# Patient Record
Sex: Female | Born: 1975 | Race: White | Hispanic: No | Marital: Married | Smoking: Never smoker
Health system: Southern US, Community
[De-identification: ages and names within clinical notes are randomized; demographics above are authoritative.]

## PROBLEM LIST (undated history)

## (undated) DIAGNOSIS — H539 Unspecified visual disturbance: Secondary | ICD-10-CM

## (undated) HISTORY — DX: Unspecified visual disturbance: H53.9

---

## 2016-11-07 DIAGNOSIS — D485 Neoplasm of uncertain behavior of skin: Secondary | ICD-10-CM | POA: Diagnosis not present

## 2016-11-07 DIAGNOSIS — L700 Acne vulgaris: Secondary | ICD-10-CM | POA: Diagnosis not present

## 2016-11-08 DIAGNOSIS — J00 Acute nasopharyngitis [common cold]: Secondary | ICD-10-CM | POA: Diagnosis not present

## 2016-11-18 DIAGNOSIS — Z01419 Encounter for gynecological examination (general) (routine) without abnormal findings: Secondary | ICD-10-CM | POA: Diagnosis not present

## 2016-11-18 DIAGNOSIS — N3941 Urge incontinence: Secondary | ICD-10-CM | POA: Diagnosis not present

## 2016-12-23 DIAGNOSIS — N358 Other urethral stricture: Secondary | ICD-10-CM | POA: Diagnosis not present

## 2016-12-23 DIAGNOSIS — N3000 Acute cystitis without hematuria: Secondary | ICD-10-CM | POA: Diagnosis not present

## 2017-01-02 DIAGNOSIS — N3020 Other chronic cystitis without hematuria: Secondary | ICD-10-CM | POA: Diagnosis not present

## 2017-01-02 DIAGNOSIS — N3281 Overactive bladder: Secondary | ICD-10-CM | POA: Diagnosis not present

## 2017-01-16 DIAGNOSIS — N3941 Urge incontinence: Secondary | ICD-10-CM | POA: Diagnosis not present

## 2017-12-16 DIAGNOSIS — L821 Other seborrheic keratosis: Secondary | ICD-10-CM | POA: Diagnosis not present

## 2017-12-16 DIAGNOSIS — D225 Melanocytic nevi of trunk: Secondary | ICD-10-CM | POA: Diagnosis not present

## 2018-01-07 DIAGNOSIS — R922 Inconclusive mammogram: Secondary | ICD-10-CM | POA: Diagnosis not present

## 2018-01-07 DIAGNOSIS — Z1231 Encounter for screening mammogram for malignant neoplasm of breast: Secondary | ICD-10-CM | POA: Diagnosis not present

## 2018-01-07 DIAGNOSIS — R140 Abdominal distension (gaseous): Secondary | ICD-10-CM | POA: Diagnosis not present

## 2018-01-07 DIAGNOSIS — Z01419 Encounter for gynecological examination (general) (routine) without abnormal findings: Secondary | ICD-10-CM | POA: Diagnosis not present

## 2018-03-17 DIAGNOSIS — M546 Pain in thoracic spine: Secondary | ICD-10-CM | POA: Diagnosis not present

## 2018-03-17 DIAGNOSIS — N200 Calculus of kidney: Secondary | ICD-10-CM | POA: Diagnosis not present

## 2018-03-17 DIAGNOSIS — Z882 Allergy status to sulfonamides status: Secondary | ICD-10-CM | POA: Diagnosis not present

## 2018-03-17 DIAGNOSIS — M542 Cervicalgia: Secondary | ICD-10-CM | POA: Diagnosis not present

## 2018-03-17 DIAGNOSIS — Z880 Allergy status to penicillin: Secondary | ICD-10-CM | POA: Diagnosis not present

## 2018-03-20 DIAGNOSIS — M79601 Pain in right arm: Secondary | ICD-10-CM | POA: Diagnosis not present

## 2018-03-20 DIAGNOSIS — R200 Anesthesia of skin: Secondary | ICD-10-CM | POA: Diagnosis not present

## 2018-03-20 DIAGNOSIS — M25511 Pain in right shoulder: Secondary | ICD-10-CM | POA: Diagnosis not present

## 2018-03-20 DIAGNOSIS — R202 Paresthesia of skin: Secondary | ICD-10-CM | POA: Diagnosis not present

## 2018-03-20 DIAGNOSIS — M542 Cervicalgia: Secondary | ICD-10-CM | POA: Diagnosis not present

## 2018-03-23 DIAGNOSIS — M542 Cervicalgia: Secondary | ICD-10-CM | POA: Diagnosis not present

## 2018-03-23 DIAGNOSIS — S39012A Strain of muscle, fascia and tendon of lower back, initial encounter: Secondary | ICD-10-CM | POA: Diagnosis not present

## 2018-03-23 DIAGNOSIS — S161XXA Strain of muscle, fascia and tendon at neck level, initial encounter: Secondary | ICD-10-CM | POA: Diagnosis not present

## 2018-03-24 DIAGNOSIS — M5416 Radiculopathy, lumbar region: Secondary | ICD-10-CM | POA: Diagnosis not present

## 2018-03-24 DIAGNOSIS — M5412 Radiculopathy, cervical region: Secondary | ICD-10-CM | POA: Diagnosis not present

## 2018-03-25 DIAGNOSIS — K5900 Constipation, unspecified: Secondary | ICD-10-CM | POA: Diagnosis not present

## 2018-04-03 DIAGNOSIS — M545 Low back pain: Secondary | ICD-10-CM | POA: Diagnosis not present

## 2018-04-03 DIAGNOSIS — M5416 Radiculopathy, lumbar region: Secondary | ICD-10-CM | POA: Diagnosis not present

## 2018-04-03 DIAGNOSIS — M542 Cervicalgia: Secondary | ICD-10-CM | POA: Diagnosis not present

## 2018-04-03 DIAGNOSIS — M5412 Radiculopathy, cervical region: Secondary | ICD-10-CM | POA: Diagnosis not present

## 2018-04-03 DIAGNOSIS — M79604 Pain in right leg: Secondary | ICD-10-CM | POA: Diagnosis not present

## 2018-04-03 DIAGNOSIS — M47812 Spondylosis without myelopathy or radiculopathy, cervical region: Secondary | ICD-10-CM | POA: Diagnosis not present

## 2018-04-03 DIAGNOSIS — M79603 Pain in arm, unspecified: Secondary | ICD-10-CM | POA: Diagnosis not present

## 2018-04-03 DIAGNOSIS — M47816 Spondylosis without myelopathy or radiculopathy, lumbar region: Secondary | ICD-10-CM | POA: Diagnosis not present

## 2018-04-07 DIAGNOSIS — M5416 Radiculopathy, lumbar region: Secondary | ICD-10-CM | POA: Diagnosis not present

## 2018-04-07 DIAGNOSIS — M5412 Radiculopathy, cervical region: Secondary | ICD-10-CM | POA: Diagnosis not present

## 2018-04-22 DIAGNOSIS — S79912A Unspecified injury of left hip, initial encounter: Secondary | ICD-10-CM | POA: Diagnosis not present

## 2018-04-22 DIAGNOSIS — R079 Chest pain, unspecified: Secondary | ICD-10-CM | POA: Diagnosis not present

## 2018-04-22 DIAGNOSIS — S299XXA Unspecified injury of thorax, initial encounter: Secondary | ICD-10-CM | POA: Diagnosis not present

## 2018-04-22 DIAGNOSIS — M25552 Pain in left hip: Secondary | ICD-10-CM | POA: Diagnosis not present

## 2018-04-22 DIAGNOSIS — M25551 Pain in right hip: Secondary | ICD-10-CM | POA: Diagnosis not present

## 2018-04-22 DIAGNOSIS — M25511 Pain in right shoulder: Secondary | ICD-10-CM | POA: Diagnosis not present

## 2018-04-22 DIAGNOSIS — M542 Cervicalgia: Secondary | ICD-10-CM | POA: Diagnosis not present

## 2018-04-22 DIAGNOSIS — S199XXA Unspecified injury of neck, initial encounter: Secondary | ICD-10-CM | POA: Diagnosis not present

## 2018-04-22 DIAGNOSIS — S134XXA Sprain of ligaments of cervical spine, initial encounter: Secondary | ICD-10-CM | POA: Diagnosis not present

## 2018-04-22 DIAGNOSIS — S79911A Unspecified injury of right hip, initial encounter: Secondary | ICD-10-CM | POA: Diagnosis not present

## 2018-05-01 DIAGNOSIS — M25511 Pain in right shoulder: Secondary | ICD-10-CM | POA: Diagnosis not present

## 2018-05-01 DIAGNOSIS — M542 Cervicalgia: Secondary | ICD-10-CM | POA: Diagnosis not present

## 2018-05-01 DIAGNOSIS — R5382 Chronic fatigue, unspecified: Secondary | ICD-10-CM | POA: Diagnosis not present

## 2018-05-01 DIAGNOSIS — R51 Headache: Secondary | ICD-10-CM | POA: Diagnosis not present

## 2018-05-06 DIAGNOSIS — M62830 Muscle spasm of back: Secondary | ICD-10-CM | POA: Diagnosis not present

## 2018-05-06 DIAGNOSIS — M542 Cervicalgia: Secondary | ICD-10-CM | POA: Diagnosis not present

## 2018-05-06 DIAGNOSIS — S134XXD Sprain of ligaments of cervical spine, subsequent encounter: Secondary | ICD-10-CM | POA: Diagnosis not present

## 2018-05-07 DIAGNOSIS — M542 Cervicalgia: Secondary | ICD-10-CM | POA: Diagnosis not present

## 2018-05-07 DIAGNOSIS — S134XXD Sprain of ligaments of cervical spine, subsequent encounter: Secondary | ICD-10-CM | POA: Diagnosis not present

## 2018-05-07 DIAGNOSIS — M62830 Muscle spasm of back: Secondary | ICD-10-CM | POA: Diagnosis not present

## 2018-05-12 DIAGNOSIS — M542 Cervicalgia: Secondary | ICD-10-CM | POA: Diagnosis not present

## 2018-05-12 DIAGNOSIS — S134XXD Sprain of ligaments of cervical spine, subsequent encounter: Secondary | ICD-10-CM | POA: Diagnosis not present

## 2018-05-12 DIAGNOSIS — M62830 Muscle spasm of back: Secondary | ICD-10-CM | POA: Diagnosis not present

## 2018-05-14 DIAGNOSIS — S134XXD Sprain of ligaments of cervical spine, subsequent encounter: Secondary | ICD-10-CM | POA: Diagnosis not present

## 2018-05-14 DIAGNOSIS — M542 Cervicalgia: Secondary | ICD-10-CM | POA: Diagnosis not present

## 2018-05-14 DIAGNOSIS — M62830 Muscle spasm of back: Secondary | ICD-10-CM | POA: Diagnosis not present

## 2018-05-19 DIAGNOSIS — M542 Cervicalgia: Secondary | ICD-10-CM | POA: Diagnosis not present

## 2018-05-19 DIAGNOSIS — S134XXD Sprain of ligaments of cervical spine, subsequent encounter: Secondary | ICD-10-CM | POA: Diagnosis not present

## 2018-05-19 DIAGNOSIS — M62830 Muscle spasm of back: Secondary | ICD-10-CM | POA: Diagnosis not present

## 2018-05-21 DIAGNOSIS — S134XXD Sprain of ligaments of cervical spine, subsequent encounter: Secondary | ICD-10-CM | POA: Diagnosis not present

## 2018-05-21 DIAGNOSIS — M542 Cervicalgia: Secondary | ICD-10-CM | POA: Diagnosis not present

## 2018-05-21 DIAGNOSIS — M62830 Muscle spasm of back: Secondary | ICD-10-CM | POA: Diagnosis not present

## 2018-05-22 DIAGNOSIS — M791 Myalgia, unspecified site: Secondary | ICD-10-CM | POA: Diagnosis not present

## 2018-05-22 DIAGNOSIS — R51 Headache: Secondary | ICD-10-CM | POA: Diagnosis not present

## 2018-05-22 DIAGNOSIS — M542 Cervicalgia: Secondary | ICD-10-CM | POA: Diagnosis not present

## 2018-05-22 DIAGNOSIS — Z1331 Encounter for screening for depression: Secondary | ICD-10-CM | POA: Diagnosis not present

## 2018-05-22 DIAGNOSIS — M25511 Pain in right shoulder: Secondary | ICD-10-CM | POA: Diagnosis not present

## 2018-05-26 DIAGNOSIS — M62830 Muscle spasm of back: Secondary | ICD-10-CM | POA: Diagnosis not present

## 2018-05-26 DIAGNOSIS — S134XXD Sprain of ligaments of cervical spine, subsequent encounter: Secondary | ICD-10-CM | POA: Diagnosis not present

## 2018-05-26 DIAGNOSIS — M542 Cervicalgia: Secondary | ICD-10-CM | POA: Diagnosis not present

## 2018-05-28 DIAGNOSIS — M542 Cervicalgia: Secondary | ICD-10-CM | POA: Diagnosis not present

## 2018-05-28 DIAGNOSIS — M62830 Muscle spasm of back: Secondary | ICD-10-CM | POA: Diagnosis not present

## 2018-05-28 DIAGNOSIS — S134XXD Sprain of ligaments of cervical spine, subsequent encounter: Secondary | ICD-10-CM | POA: Diagnosis not present

## 2018-06-02 DIAGNOSIS — M5416 Radiculopathy, lumbar region: Secondary | ICD-10-CM | POA: Diagnosis not present

## 2018-06-02 DIAGNOSIS — M62830 Muscle spasm of back: Secondary | ICD-10-CM | POA: Diagnosis not present

## 2018-06-02 DIAGNOSIS — S134XXD Sprain of ligaments of cervical spine, subsequent encounter: Secondary | ICD-10-CM | POA: Diagnosis not present

## 2018-06-02 DIAGNOSIS — M5412 Radiculopathy, cervical region: Secondary | ICD-10-CM | POA: Diagnosis not present

## 2018-06-02 DIAGNOSIS — M542 Cervicalgia: Secondary | ICD-10-CM | POA: Diagnosis not present

## 2018-06-04 DIAGNOSIS — S134XXD Sprain of ligaments of cervical spine, subsequent encounter: Secondary | ICD-10-CM | POA: Diagnosis not present

## 2018-06-04 DIAGNOSIS — M542 Cervicalgia: Secondary | ICD-10-CM | POA: Diagnosis not present

## 2018-06-04 DIAGNOSIS — M62830 Muscle spasm of back: Secondary | ICD-10-CM | POA: Diagnosis not present

## 2018-06-08 DIAGNOSIS — R51 Headache: Secondary | ICD-10-CM | POA: Diagnosis not present

## 2018-06-08 DIAGNOSIS — F0781 Postconcussional syndrome: Secondary | ICD-10-CM | POA: Diagnosis not present

## 2018-06-09 DIAGNOSIS — S134XXD Sprain of ligaments of cervical spine, subsequent encounter: Secondary | ICD-10-CM | POA: Diagnosis not present

## 2018-06-09 DIAGNOSIS — M62830 Muscle spasm of back: Secondary | ICD-10-CM | POA: Diagnosis not present

## 2018-06-09 DIAGNOSIS — M542 Cervicalgia: Secondary | ICD-10-CM | POA: Diagnosis not present

## 2018-07-02 DIAGNOSIS — M9901 Segmental and somatic dysfunction of cervical region: Secondary | ICD-10-CM | POA: Diagnosis not present

## 2018-07-02 DIAGNOSIS — R51 Headache: Secondary | ICD-10-CM | POA: Diagnosis not present

## 2018-07-02 DIAGNOSIS — M9902 Segmental and somatic dysfunction of thoracic region: Secondary | ICD-10-CM | POA: Diagnosis not present

## 2018-07-02 DIAGNOSIS — S134XXA Sprain of ligaments of cervical spine, initial encounter: Secondary | ICD-10-CM | POA: Diagnosis not present

## 2018-07-07 DIAGNOSIS — M9902 Segmental and somatic dysfunction of thoracic region: Secondary | ICD-10-CM | POA: Diagnosis not present

## 2018-07-07 DIAGNOSIS — M9901 Segmental and somatic dysfunction of cervical region: Secondary | ICD-10-CM | POA: Diagnosis not present

## 2018-07-07 DIAGNOSIS — S134XXA Sprain of ligaments of cervical spine, initial encounter: Secondary | ICD-10-CM | POA: Diagnosis not present

## 2018-07-07 DIAGNOSIS — R51 Headache: Secondary | ICD-10-CM | POA: Diagnosis not present

## 2018-07-08 DIAGNOSIS — R51 Headache: Secondary | ICD-10-CM | POA: Diagnosis not present

## 2018-07-08 DIAGNOSIS — M25511 Pain in right shoulder: Secondary | ICD-10-CM | POA: Diagnosis not present

## 2018-07-08 DIAGNOSIS — F0781 Postconcussional syndrome: Secondary | ICD-10-CM | POA: Diagnosis not present

## 2018-07-08 DIAGNOSIS — M542 Cervicalgia: Secondary | ICD-10-CM | POA: Diagnosis not present

## 2018-07-14 ENCOUNTER — Encounter: Payer: Self-pay | Admitting: Neurology

## 2018-07-14 ENCOUNTER — Other Ambulatory Visit: Payer: Self-pay

## 2018-07-14 ENCOUNTER — Ambulatory Visit: Payer: BLUE CROSS/BLUE SHIELD | Admitting: Neurology

## 2018-07-14 DIAGNOSIS — S134XXA Sprain of ligaments of cervical spine, initial encounter: Secondary | ICD-10-CM | POA: Diagnosis not present

## 2018-07-14 DIAGNOSIS — R51 Headache: Secondary | ICD-10-CM | POA: Diagnosis not present

## 2018-07-14 DIAGNOSIS — M9902 Segmental and somatic dysfunction of thoracic region: Secondary | ICD-10-CM | POA: Diagnosis not present

## 2018-07-14 DIAGNOSIS — M9901 Segmental and somatic dysfunction of cervical region: Secondary | ICD-10-CM | POA: Diagnosis not present

## 2018-07-14 DIAGNOSIS — G4486 Cervicogenic headache: Secondary | ICD-10-CM | POA: Insufficient documentation

## 2018-07-14 MED ORDER — MELOXICAM 15 MG PO TABS: 15.0000 mg | ORAL_TABLET | Freq: Every day | ORAL | 3 refills | Status: DC

## 2018-07-14 MED ORDER — NORTRIPTYLINE HCL 10 MG PO CAPS: ORAL_CAPSULE | ORAL | 3 refills | Status: DC

## 2018-07-14 NOTE — Patient Instructions (Signed)
We will start Mobic 15 mg a day and nortriptyline at night for the headache.   Pamelor (nortriptyline) is an antidepressant medication that has many uses that may include headache, whiplash injuries, or for peripheral neuropathy pain. Side effects may include drowsiness, dry mouth, blurred vision, or constipation. As with any antidepressant medication, worsening depression may occur. If you had any significant side effects, please call our office. The full effects of this medication may take 7-10 days after starting the drug, or going up on the dose.
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
## 2018-07-23 ENCOUNTER — Telehealth: Payer: Self-pay | Admitting: Neurology

## 2018-07-23 NOTE — Telephone Encounter (Signed)
I called the patient.  The patient has not even gotten up to the 30 mg dose of the nortriptyline yet.  As long as she is tolerating the drug she is to stay with the medication, call me in a couple weeks, if she still having headaches we will push the dose to 50 mg or 75 mg dose to help the cervicogenic headache.  The beneficial effects of the nortriptyline may take a week or more after the dose has been increased.

## 2018-07-23 NOTE — Telephone Encounter (Signed)
Pt requesting a call stating that nortriptyline (PAMELOR) 10 MG capsule has not helped since starting. Also stating that after taking her 1st dose she had the "wrost h/a of my life". Stating every so often she experiences sharpe pains in head and a tingling sensation.  Requesting a call to discuss

## 2018-07-24 DIAGNOSIS — S134XXA Sprain of ligaments of cervical spine, initial encounter: Secondary | ICD-10-CM | POA: Diagnosis not present

## 2018-07-24 DIAGNOSIS — M9901 Segmental and somatic dysfunction of cervical region: Secondary | ICD-10-CM | POA: Diagnosis not present

## 2018-07-24 DIAGNOSIS — R51 Headache: Secondary | ICD-10-CM | POA: Diagnosis not present

## 2018-07-24 DIAGNOSIS — M9902 Segmental and somatic dysfunction of thoracic region: Secondary | ICD-10-CM | POA: Diagnosis not present

## 2018-07-28 DIAGNOSIS — S134XXA Sprain of ligaments of cervical spine, initial encounter: Secondary | ICD-10-CM | POA: Diagnosis not present

## 2018-07-28 DIAGNOSIS — M9901 Segmental and somatic dysfunction of cervical region: Secondary | ICD-10-CM | POA: Diagnosis not present

## 2018-07-28 DIAGNOSIS — M9902 Segmental and somatic dysfunction of thoracic region: Secondary | ICD-10-CM | POA: Diagnosis not present

## 2018-07-30 DIAGNOSIS — M9902 Segmental and somatic dysfunction of thoracic region: Secondary | ICD-10-CM | POA: Diagnosis not present

## 2018-07-30 DIAGNOSIS — M9901 Segmental and somatic dysfunction of cervical region: Secondary | ICD-10-CM | POA: Diagnosis not present

## 2018-07-30 DIAGNOSIS — R51 Headache: Secondary | ICD-10-CM | POA: Diagnosis not present

## 2018-07-30 DIAGNOSIS — S134XXA Sprain of ligaments of cervical spine, initial encounter: Secondary | ICD-10-CM | POA: Diagnosis not present

## 2018-08-04 DIAGNOSIS — M9902 Segmental and somatic dysfunction of thoracic region: Secondary | ICD-10-CM | POA: Diagnosis not present

## 2018-08-04 DIAGNOSIS — M9901 Segmental and somatic dysfunction of cervical region: Secondary | ICD-10-CM | POA: Diagnosis not present

## 2018-08-04 DIAGNOSIS — S134XXA Sprain of ligaments of cervical spine, initial encounter: Secondary | ICD-10-CM | POA: Diagnosis not present

## 2018-08-04 DIAGNOSIS — R51 Headache: Secondary | ICD-10-CM | POA: Diagnosis not present

## 2018-08-10 DIAGNOSIS — M62830 Muscle spasm of back: Secondary | ICD-10-CM | POA: Diagnosis not present

## 2018-08-10 DIAGNOSIS — M542 Cervicalgia: Secondary | ICD-10-CM | POA: Diagnosis not present

## 2018-08-10 DIAGNOSIS — S134XXD Sprain of ligaments of cervical spine, subsequent encounter: Secondary | ICD-10-CM | POA: Diagnosis not present

## 2018-08-12 DIAGNOSIS — R51 Headache: Secondary | ICD-10-CM | POA: Diagnosis not present

## 2018-08-12 DIAGNOSIS — M9902 Segmental and somatic dysfunction of thoracic region: Secondary | ICD-10-CM | POA: Diagnosis not present

## 2018-08-12 DIAGNOSIS — M9901 Segmental and somatic dysfunction of cervical region: Secondary | ICD-10-CM | POA: Diagnosis not present

## 2018-08-12 DIAGNOSIS — S134XXA Sprain of ligaments of cervical spine, initial encounter: Secondary | ICD-10-CM | POA: Diagnosis not present

## 2018-08-14 DIAGNOSIS — M62830 Muscle spasm of back: Secondary | ICD-10-CM | POA: Diagnosis not present

## 2018-08-14 DIAGNOSIS — S134XXD Sprain of ligaments of cervical spine, subsequent encounter: Secondary | ICD-10-CM | POA: Diagnosis not present

## 2018-08-14 DIAGNOSIS — M542 Cervicalgia: Secondary | ICD-10-CM | POA: Diagnosis not present

## 2018-08-17 DIAGNOSIS — M542 Cervicalgia: Secondary | ICD-10-CM | POA: Diagnosis not present

## 2018-08-17 DIAGNOSIS — M62830 Muscle spasm of back: Secondary | ICD-10-CM | POA: Diagnosis not present

## 2018-08-17 DIAGNOSIS — S134XXD Sprain of ligaments of cervical spine, subsequent encounter: Secondary | ICD-10-CM | POA: Diagnosis not present

## 2018-08-18 DIAGNOSIS — Z1231 Encounter for screening mammogram for malignant neoplasm of breast: Secondary | ICD-10-CM | POA: Diagnosis not present

## 2018-08-19 DIAGNOSIS — S134XXD Sprain of ligaments of cervical spine, subsequent encounter: Secondary | ICD-10-CM | POA: Diagnosis not present

## 2018-08-19 DIAGNOSIS — M542 Cervicalgia: Secondary | ICD-10-CM | POA: Diagnosis not present

## 2018-08-19 DIAGNOSIS — M62830 Muscle spasm of back: Secondary | ICD-10-CM | POA: Diagnosis not present

## 2018-08-20 DIAGNOSIS — K5909 Other constipation: Secondary | ICD-10-CM | POA: Diagnosis not present

## 2018-08-20 DIAGNOSIS — F0781 Postconcussional syndrome: Secondary | ICD-10-CM | POA: Diagnosis not present

## 2018-08-20 DIAGNOSIS — M25511 Pain in right shoulder: Secondary | ICD-10-CM | POA: Diagnosis not present

## 2018-08-20 DIAGNOSIS — M542 Cervicalgia: Secondary | ICD-10-CM | POA: Diagnosis not present

## 2018-08-21 DIAGNOSIS — M5412 Radiculopathy, cervical region: Secondary | ICD-10-CM | POA: Diagnosis not present

## 2018-08-21 DIAGNOSIS — M5416 Radiculopathy, lumbar region: Secondary | ICD-10-CM | POA: Diagnosis not present

## 2018-08-24 DIAGNOSIS — M62830 Muscle spasm of back: Secondary | ICD-10-CM | POA: Diagnosis not present

## 2018-08-24 DIAGNOSIS — S134XXD Sprain of ligaments of cervical spine, subsequent encounter: Secondary | ICD-10-CM | POA: Diagnosis not present

## 2018-08-24 DIAGNOSIS — M542 Cervicalgia: Secondary | ICD-10-CM | POA: Diagnosis not present

## 2018-08-26 DIAGNOSIS — S134XXD Sprain of ligaments of cervical spine, subsequent encounter: Secondary | ICD-10-CM | POA: Diagnosis not present

## 2018-08-26 DIAGNOSIS — M62830 Muscle spasm of back: Secondary | ICD-10-CM | POA: Diagnosis not present

## 2018-08-26 DIAGNOSIS — M542 Cervicalgia: Secondary | ICD-10-CM | POA: Diagnosis not present

## 2018-09-07 DIAGNOSIS — S134XXD Sprain of ligaments of cervical spine, subsequent encounter: Secondary | ICD-10-CM | POA: Diagnosis not present

## 2018-09-07 DIAGNOSIS — M62830 Muscle spasm of back: Secondary | ICD-10-CM | POA: Diagnosis not present

## 2018-09-07 DIAGNOSIS — M542 Cervicalgia: Secondary | ICD-10-CM | POA: Diagnosis not present

## 2018-09-08 DIAGNOSIS — M62830 Muscle spasm of back: Secondary | ICD-10-CM | POA: Diagnosis not present

## 2018-09-08 DIAGNOSIS — S134XXD Sprain of ligaments of cervical spine, subsequent encounter: Secondary | ICD-10-CM | POA: Diagnosis not present

## 2018-09-08 DIAGNOSIS — M542 Cervicalgia: Secondary | ICD-10-CM | POA: Diagnosis not present

## 2018-09-14 DIAGNOSIS — S134XXD Sprain of ligaments of cervical spine, subsequent encounter: Secondary | ICD-10-CM | POA: Diagnosis not present

## 2018-09-14 DIAGNOSIS — M542 Cervicalgia: Secondary | ICD-10-CM | POA: Diagnosis not present

## 2018-09-14 DIAGNOSIS — M62830 Muscle spasm of back: Secondary | ICD-10-CM | POA: Diagnosis not present

## 2018-09-16 DIAGNOSIS — S134XXD Sprain of ligaments of cervical spine, subsequent encounter: Secondary | ICD-10-CM | POA: Diagnosis not present

## 2018-09-16 DIAGNOSIS — M62830 Muscle spasm of back: Secondary | ICD-10-CM | POA: Diagnosis not present

## 2018-09-16 DIAGNOSIS — M542 Cervicalgia: Secondary | ICD-10-CM | POA: Diagnosis not present

## 2018-09-17 ENCOUNTER — Encounter: Payer: Self-pay | Admitting: Neurology

## 2018-09-17 ENCOUNTER — Ambulatory Visit: Payer: BLUE CROSS/BLUE SHIELD | Admitting: Neurology

## 2018-09-17 VITALS — BP 99/57 | HR 63 | Ht 64.0 in | Wt 155.0 lb

## 2018-09-17 DIAGNOSIS — R51 Headache: Secondary | ICD-10-CM | POA: Diagnosis not present

## 2018-09-17 DIAGNOSIS — G4486 Cervicogenic headache: Secondary | ICD-10-CM

## 2018-09-17 MED ORDER — GABAPENTIN 100 MG PO CAPS: ORAL_CAPSULE | ORAL | 3 refills | Status: AC

## 2018-09-17 MED ORDER — MELOXICAM 15 MG PO TABS: 15.0000 mg | ORAL_TABLET | Freq: Every day | ORAL | 3 refills | Status: DC

## 2018-09-17 MED ORDER — MELOXICAM 15 MG PO TABS: 15.0000 mg | ORAL_TABLET | Freq: Every day | ORAL | 3 refills | Status: AC
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
## 2018-09-17 NOTE — Patient Instructions (Signed)
Go to 20 mg of nortriptyline for 5 days, then take 10 mg at night for 5 days and then stop.  Start gabapentin for the pain, call for any dose adjustments.  Neurontin (gabapentin) may result in drowsiness, ankle swelling, gait instability, or possibly dizziness. Please contact our office if significant side effects occur with this medication.

## 2018-09-21 DIAGNOSIS — S134XXD Sprain of ligaments of cervical spine, subsequent encounter: Secondary | ICD-10-CM | POA: Diagnosis not present

## 2018-09-21 DIAGNOSIS — M62830 Muscle spasm of back: Secondary | ICD-10-CM | POA: Diagnosis not present

## 2018-09-21 DIAGNOSIS — M542 Cervicalgia: Secondary | ICD-10-CM | POA: Diagnosis not present

## 2018-09-23 DIAGNOSIS — M542 Cervicalgia: Secondary | ICD-10-CM | POA: Diagnosis not present

## 2018-09-23 DIAGNOSIS — M62830 Muscle spasm of back: Secondary | ICD-10-CM | POA: Diagnosis not present

## 2018-09-23 DIAGNOSIS — S134XXD Sprain of ligaments of cervical spine, subsequent encounter: Secondary | ICD-10-CM | POA: Diagnosis not present

## 2018-10-01 DIAGNOSIS — M25511 Pain in right shoulder: Secondary | ICD-10-CM | POA: Diagnosis not present

## 2018-10-01 DIAGNOSIS — R51 Headache: Secondary | ICD-10-CM | POA: Diagnosis not present

## 2018-10-01 DIAGNOSIS — M62830 Muscle spasm of back: Secondary | ICD-10-CM | POA: Diagnosis not present

## 2018-10-01 DIAGNOSIS — M542 Cervicalgia: Secondary | ICD-10-CM | POA: Diagnosis not present

## 2018-10-01 DIAGNOSIS — F0781 Postconcussional syndrome: Secondary | ICD-10-CM | POA: Diagnosis not present

## 2018-10-01 DIAGNOSIS — S134XXD Sprain of ligaments of cervical spine, subsequent encounter: Secondary | ICD-10-CM | POA: Diagnosis not present

## 2018-10-05 DIAGNOSIS — M62830 Muscle spasm of back: Secondary | ICD-10-CM | POA: Diagnosis not present

## 2018-10-05 DIAGNOSIS — M542 Cervicalgia: Secondary | ICD-10-CM | POA: Diagnosis not present

## 2018-10-05 DIAGNOSIS — S134XXD Sprain of ligaments of cervical spine, subsequent encounter: Secondary | ICD-10-CM | POA: Diagnosis not present

## 2018-10-08 DIAGNOSIS — M62830 Muscle spasm of back: Secondary | ICD-10-CM | POA: Diagnosis not present

## 2018-10-08 DIAGNOSIS — S134XXD Sprain of ligaments of cervical spine, subsequent encounter: Secondary | ICD-10-CM | POA: Diagnosis not present

## 2018-10-08 DIAGNOSIS — M542 Cervicalgia: Secondary | ICD-10-CM | POA: Diagnosis not present

## 2018-10-13 DIAGNOSIS — M62830 Muscle spasm of back: Secondary | ICD-10-CM | POA: Diagnosis not present

## 2018-10-13 DIAGNOSIS — M542 Cervicalgia: Secondary | ICD-10-CM | POA: Diagnosis not present

## 2018-10-13 DIAGNOSIS — S134XXD Sprain of ligaments of cervical spine, subsequent encounter: Secondary | ICD-10-CM | POA: Diagnosis not present

## 2018-10-15 DIAGNOSIS — M62830 Muscle spasm of back: Secondary | ICD-10-CM | POA: Diagnosis not present

## 2018-10-15 DIAGNOSIS — M542 Cervicalgia: Secondary | ICD-10-CM | POA: Diagnosis not present

## 2018-10-15 DIAGNOSIS — S134XXD Sprain of ligaments of cervical spine, subsequent encounter: Secondary | ICD-10-CM | POA: Diagnosis not present

## 2018-10-20 DIAGNOSIS — S134XXD Sprain of ligaments of cervical spine, subsequent encounter: Secondary | ICD-10-CM | POA: Diagnosis not present

## 2018-10-20 DIAGNOSIS — M62830 Muscle spasm of back: Secondary | ICD-10-CM | POA: Diagnosis not present

## 2018-10-20 DIAGNOSIS — M542 Cervicalgia: Secondary | ICD-10-CM | POA: Diagnosis not present

## 2018-10-22 DIAGNOSIS — M542 Cervicalgia: Secondary | ICD-10-CM | POA: Diagnosis not present

## 2018-10-22 DIAGNOSIS — S134XXD Sprain of ligaments of cervical spine, subsequent encounter: Secondary | ICD-10-CM | POA: Diagnosis not present

## 2018-10-22 DIAGNOSIS — M62830 Muscle spasm of back: Secondary | ICD-10-CM | POA: Diagnosis not present

## 2018-10-28 DIAGNOSIS — S134XXD Sprain of ligaments of cervical spine, subsequent encounter: Secondary | ICD-10-CM | POA: Diagnosis not present

## 2018-10-28 DIAGNOSIS — M62830 Muscle spasm of back: Secondary | ICD-10-CM | POA: Diagnosis not present

## 2018-10-28 DIAGNOSIS — M542 Cervicalgia: Secondary | ICD-10-CM | POA: Diagnosis not present

## 2018-11-02 DIAGNOSIS — M542 Cervicalgia: Secondary | ICD-10-CM | POA: Diagnosis not present

## 2018-11-02 DIAGNOSIS — S134XXD Sprain of ligaments of cervical spine, subsequent encounter: Secondary | ICD-10-CM | POA: Diagnosis not present

## 2018-11-02 DIAGNOSIS — M62830 Muscle spasm of back: Secondary | ICD-10-CM | POA: Diagnosis not present

## 2018-11-05 DIAGNOSIS — F0781 Postconcussional syndrome: Secondary | ICD-10-CM | POA: Diagnosis not present

## 2018-11-05 DIAGNOSIS — R51 Headache: Secondary | ICD-10-CM | POA: Diagnosis not present

## 2018-11-05 DIAGNOSIS — M25511 Pain in right shoulder: Secondary | ICD-10-CM | POA: Diagnosis not present

## 2018-11-05 DIAGNOSIS — K5909 Other constipation: Secondary | ICD-10-CM | POA: Diagnosis not present

## 2018-11-10 DIAGNOSIS — K5909 Other constipation: Secondary | ICD-10-CM | POA: Diagnosis not present

## 2018-11-10 DIAGNOSIS — M542 Cervicalgia: Secondary | ICD-10-CM | POA: Diagnosis not present

## 2018-11-10 DIAGNOSIS — M549 Dorsalgia, unspecified: Secondary | ICD-10-CM | POA: Diagnosis not present

## 2018-11-10 DIAGNOSIS — S134XXD Sprain of ligaments of cervical spine, subsequent encounter: Secondary | ICD-10-CM | POA: Diagnosis not present

## 2018-11-10 DIAGNOSIS — F0781 Postconcussional syndrome: Secondary | ICD-10-CM | POA: Diagnosis not present

## 2018-11-10 DIAGNOSIS — M25551 Pain in right hip: Secondary | ICD-10-CM | POA: Diagnosis not present

## 2018-11-10 DIAGNOSIS — M62830 Muscle spasm of back: Secondary | ICD-10-CM | POA: Diagnosis not present

## 2018-11-11 ENCOUNTER — Other Ambulatory Visit: Payer: Self-pay | Admitting: Neurology

## 2018-11-11 DIAGNOSIS — M542 Cervicalgia: Principal | ICD-10-CM

## 2018-11-11 DIAGNOSIS — G8929 Other chronic pain: Secondary | ICD-10-CM

## 2018-11-11 NOTE — Progress Notes (Signed)
MRI cervical study will be done, the patient is having ongoing neck problems and pain and numbness down the right arm and leg.  She has not responded to medications, physical therapy, and to steroid injections.

## 2018-11-13 ENCOUNTER — Telehealth: Payer: Self-pay | Admitting: Neurology

## 2018-11-13 NOTE — Telephone Encounter (Signed)
before doing the auth i wanted to see if the pt wants her mri done at [facility] in [place] i lvm for pt to call back.

## 2018-11-16 MED ORDER — ALPRAZOLAM 0.5 MG PO TABS: ORAL_TABLET | ORAL | 0 refills | Status: AC

## 2018-11-16 NOTE — Telephone Encounter (Signed)
I will send in a prescription for alprazolam. 

## 2018-11-16 NOTE — Addendum Note (Signed)
Addended by: York Spaniel on: 11/16/2018 11:27 AM   Modules accepted: Orders

## 2018-11-16 NOTE — Telephone Encounter (Signed)
Patient is also claustrophobic and would like something to help her.

## 2018-11-16 NOTE — Telephone Encounter (Signed)
[employer] Auth: [id number] (exp. 11/16/18 to 12/15/18) order faxed to [facility] at [place]. Ph # [phone] fax # [phone]. Per Dr. Anne Hahn wants her to do the MRI Cervical.

## 2018-11-17 ENCOUNTER — Telehealth: Payer: Self-pay

## 2018-11-17 NOTE — Telephone Encounter (Signed)
Received a call from a [place] at [facility] ( pt's PCP office). Jacqueline Ruiz states the patient had left a vm for them stating a letter from PCP needed to be sent stating Dr. Anne Ruiz should order a MRI of her Lumbar. I explained to Jacqueline Ruiz we had received a request from the patient to order a lumbar MRI, but Dr. Anne Ruiz denied and stated he would check the cervical MRI. I further explained Dr. Anne Ruiz evaluates Jacqueline Ruiz for chronic neck pain and cervical MRI would be the test completed at this time. She verbalized understanding and had no further questions/concerns.

## 2018-11-27 DIAGNOSIS — M542 Cervicalgia: Secondary | ICD-10-CM | POA: Diagnosis not present

## 2018-11-27 DIAGNOSIS — G8929 Other chronic pain: Secondary | ICD-10-CM | POA: Diagnosis not present

## 2018-12-02 DIAGNOSIS — F0781 Postconcussional syndrome: Secondary | ICD-10-CM | POA: Diagnosis not present

## 2018-12-02 DIAGNOSIS — Z6827 Body mass index (BMI) 27.0-27.9, adult: Secondary | ICD-10-CM | POA: Diagnosis not present

## 2018-12-02 DIAGNOSIS — F419 Anxiety disorder, unspecified: Secondary | ICD-10-CM | POA: Diagnosis not present

## 2018-12-02 DIAGNOSIS — K5909 Other constipation: Secondary | ICD-10-CM | POA: Diagnosis not present

## 2018-12-02 DIAGNOSIS — M549 Dorsalgia, unspecified: Secondary | ICD-10-CM | POA: Diagnosis not present

## 2018-12-04 DIAGNOSIS — M545 Low back pain: Secondary | ICD-10-CM | POA: Diagnosis not present

## 2018-12-14 DIAGNOSIS — K5909 Other constipation: Secondary | ICD-10-CM | POA: Diagnosis not present

## 2018-12-14 DIAGNOSIS — F0781 Postconcussional syndrome: Secondary | ICD-10-CM | POA: Diagnosis not present

## 2018-12-14 DIAGNOSIS — Z6827 Body mass index (BMI) 27.0-27.9, adult: Secondary | ICD-10-CM | POA: Diagnosis not present

## 2018-12-14 DIAGNOSIS — F419 Anxiety disorder, unspecified: Secondary | ICD-10-CM | POA: Diagnosis not present

## 2018-12-14 DIAGNOSIS — M549 Dorsalgia, unspecified: Secondary | ICD-10-CM | POA: Diagnosis not present

## 2019-01-19 ENCOUNTER — Ambulatory Visit: Payer: BLUE CROSS/BLUE SHIELD | Admitting: Neurology

## 2019-02-16 ENCOUNTER — Telehealth: Payer: Self-pay | Admitting: *Deleted

## 2019-02-16 NOTE — Telephone Encounter (Signed)
LMVM for pt that needing r/s appt from 02-25-19 to another date.  Due to current COVID 19 pandemic, our office is severely reducing in office visits until further notice, in order to minimize the risk to our patients and healthcare providers. Please call back.

## 2019-02-25 ENCOUNTER — Ambulatory Visit: Payer: BLUE CROSS/BLUE SHIELD | Admitting: Neurology

## 2019-02-25 DIAGNOSIS — Z1329 Encounter for screening for other suspected endocrine disorder: Secondary | ICD-10-CM | POA: Diagnosis not present

## 2019-02-25 DIAGNOSIS — Z823 Family history of stroke: Secondary | ICD-10-CM | POA: Diagnosis not present

## 2019-02-25 DIAGNOSIS — Z01419 Encounter for gynecological examination (general) (routine) without abnormal findings: Secondary | ICD-10-CM | POA: Diagnosis not present

## 2019-04-23 DIAGNOSIS — F419 Anxiety disorder, unspecified: Secondary | ICD-10-CM | POA: Diagnosis not present

## 2019-04-23 DIAGNOSIS — M549 Dorsalgia, unspecified: Secondary | ICD-10-CM | POA: Diagnosis not present

## 2019-04-23 DIAGNOSIS — K5909 Other constipation: Secondary | ICD-10-CM | POA: Diagnosis not present

## 2019-04-23 DIAGNOSIS — F0781 Postconcussional syndrome: Secondary | ICD-10-CM | POA: Diagnosis not present

## 2019-05-07 DIAGNOSIS — E785 Hyperlipidemia, unspecified: Secondary | ICD-10-CM | POA: Diagnosis not present

## 2019-05-07 DIAGNOSIS — M542 Cervicalgia: Secondary | ICD-10-CM | POA: Diagnosis not present

## 2019-05-07 DIAGNOSIS — F0781 Postconcussional syndrome: Secondary | ICD-10-CM | POA: Diagnosis not present

## 2019-05-07 DIAGNOSIS — M549 Dorsalgia, unspecified: Secondary | ICD-10-CM | POA: Diagnosis not present

## 2019-07-16 DIAGNOSIS — K5909 Other constipation: Secondary | ICD-10-CM | POA: Diagnosis not present

## 2019-07-16 DIAGNOSIS — Z6824 Body mass index (BMI) 24.0-24.9, adult: Secondary | ICD-10-CM | POA: Diagnosis not present

## 2019-07-16 DIAGNOSIS — E785 Hyperlipidemia, unspecified: Secondary | ICD-10-CM | POA: Diagnosis not present

## 2019-07-16 DIAGNOSIS — M25511 Pain in right shoulder: Secondary | ICD-10-CM | POA: Diagnosis not present

## 2019-07-26 ENCOUNTER — Telehealth: Payer: Self-pay | Admitting: Neurology

## 2019-07-26 NOTE — Telephone Encounter (Signed)
Pt has called stating that her primary doctor has  Suggested she calls for a 2nd opinion from what Dr Jannifer Franklin suggested.  Pt is asking to change her provider from Dr Jannifer Franklin.  Pt was told her request would be submitted.

## 2019-08-04 NOTE — Telephone Encounter (Signed)
FYI-I called patient and left a message req a call bk to see which provider she'd like to see for the second opinion. I let her know we'll need to get the ok from that provider first before scheduling.

## 2019-08-10 DIAGNOSIS — M5441 Lumbago with sciatica, right side: Secondary | ICD-10-CM | POA: Diagnosis not present

## 2019-08-10 DIAGNOSIS — M5481 Occipital neuralgia: Secondary | ICD-10-CM | POA: Diagnosis not present

## 2019-08-10 DIAGNOSIS — F0781 Postconcussional syndrome: Secondary | ICD-10-CM | POA: Diagnosis not present

## 2019-08-10 DIAGNOSIS — M542 Cervicalgia: Secondary | ICD-10-CM | POA: Diagnosis not present

## 2019-08-19 DIAGNOSIS — Z1331 Encounter for screening for depression: Secondary | ICD-10-CM | POA: Diagnosis not present

## 2019-08-19 DIAGNOSIS — F0781 Postconcussional syndrome: Secondary | ICD-10-CM | POA: Diagnosis not present

## 2019-08-19 DIAGNOSIS — K5909 Other constipation: Secondary | ICD-10-CM | POA: Diagnosis not present

## 2019-08-19 DIAGNOSIS — R519 Headache, unspecified: Secondary | ICD-10-CM | POA: Diagnosis not present

## 2019-08-19 DIAGNOSIS — E785 Hyperlipidemia, unspecified: Secondary | ICD-10-CM | POA: Diagnosis not present

## 2019-08-25 DIAGNOSIS — Z1231 Encounter for screening mammogram for malignant neoplasm of breast: Secondary | ICD-10-CM | POA: Diagnosis not present

## 2019-08-25 DIAGNOSIS — M542 Cervicalgia: Secondary | ICD-10-CM | POA: Diagnosis not present

## 2019-08-25 DIAGNOSIS — G44221 Chronic tension-type headache, intractable: Secondary | ICD-10-CM | POA: Diagnosis not present

## 2019-08-25 DIAGNOSIS — M545 Low back pain: Secondary | ICD-10-CM | POA: Diagnosis not present

## 2019-08-28 DIAGNOSIS — J208 Acute bronchitis due to other specified organisms: Secondary | ICD-10-CM | POA: Diagnosis not present

## 2019-08-28 DIAGNOSIS — R6889 Other general symptoms and signs: Secondary | ICD-10-CM | POA: Diagnosis not present

## 2019-08-30 DIAGNOSIS — M545 Low back pain: Secondary | ICD-10-CM | POA: Diagnosis not present

## 2019-08-30 DIAGNOSIS — M542 Cervicalgia: Secondary | ICD-10-CM | POA: Diagnosis not present

## 2019-08-30 DIAGNOSIS — G44221 Chronic tension-type headache, intractable: Secondary | ICD-10-CM | POA: Diagnosis not present

## 2020-01-10 ENCOUNTER — Other Ambulatory Visit: Payer: Self-pay | Admitting: Orthopedic Surgery

## 2020-01-10 DIAGNOSIS — M25511 Pain in right shoulder: Secondary | ICD-10-CM

## 2020-01-19 ENCOUNTER — Other Ambulatory Visit: Payer: Self-pay | Admitting: Orthopedic Surgery

## 2020-01-24 ENCOUNTER — Other Ambulatory Visit: Payer: Self-pay

## 2020-01-24 ENCOUNTER — Ambulatory Visit
Admission: RE | Admit: 2020-01-24 | Discharge: 2020-01-24 | Disposition: A | Discharge status: Home / Self Care | Payer: BLUE CROSS/BLUE SHIELD | Source: Ambulatory Visit | Attending: Orthopedic Surgery | Admitting: Orthopedic Surgery

## 2020-01-24 DIAGNOSIS — M25511 Pain in right shoulder: Secondary | ICD-10-CM
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
## 2020-01-24 IMAGING — XA DG FLUORO GUIDE NDL PLC/BX
3 series · 3 of 3 positions shown · non-contrast
Comparison: none

CLINICAL DATA: Pain and limited range of motion.

[Series 1: ortho standard · 1 of 1 slices shown (1 of 3)]
[im 1/1]
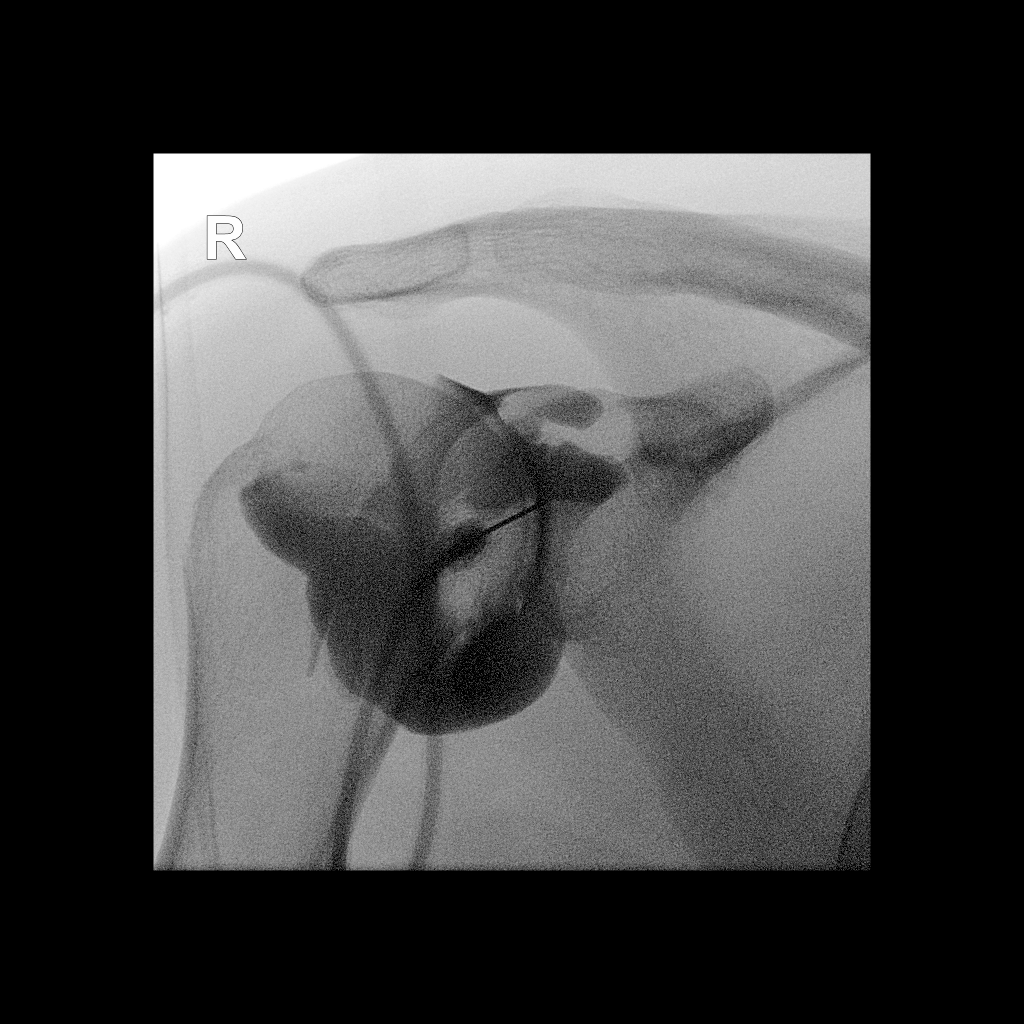

[Series 3: ortho standard · 1 of 1 slices shown (2 of 3)]
[im 1/1]
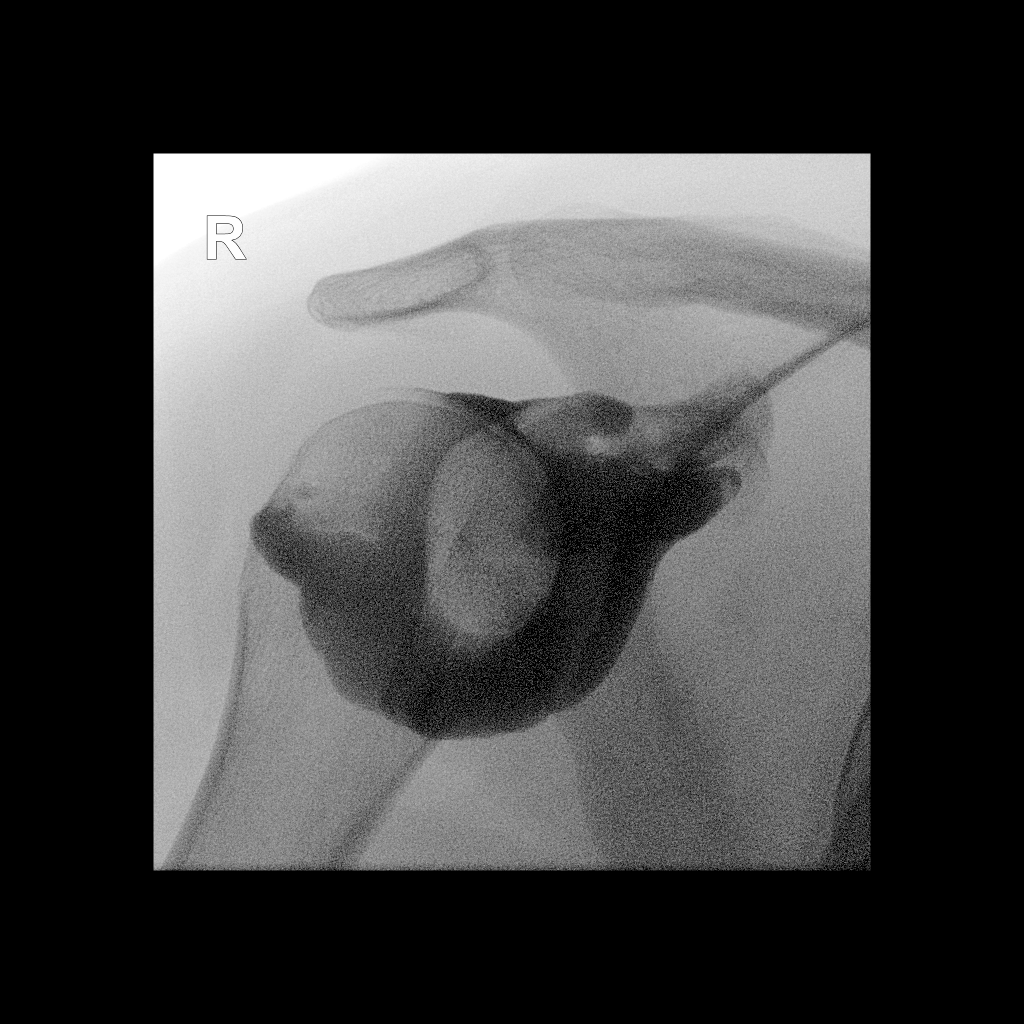

[Series 4: ortho standard · 1 of 1 slices shown (3 of 3)]
[im 1/1]
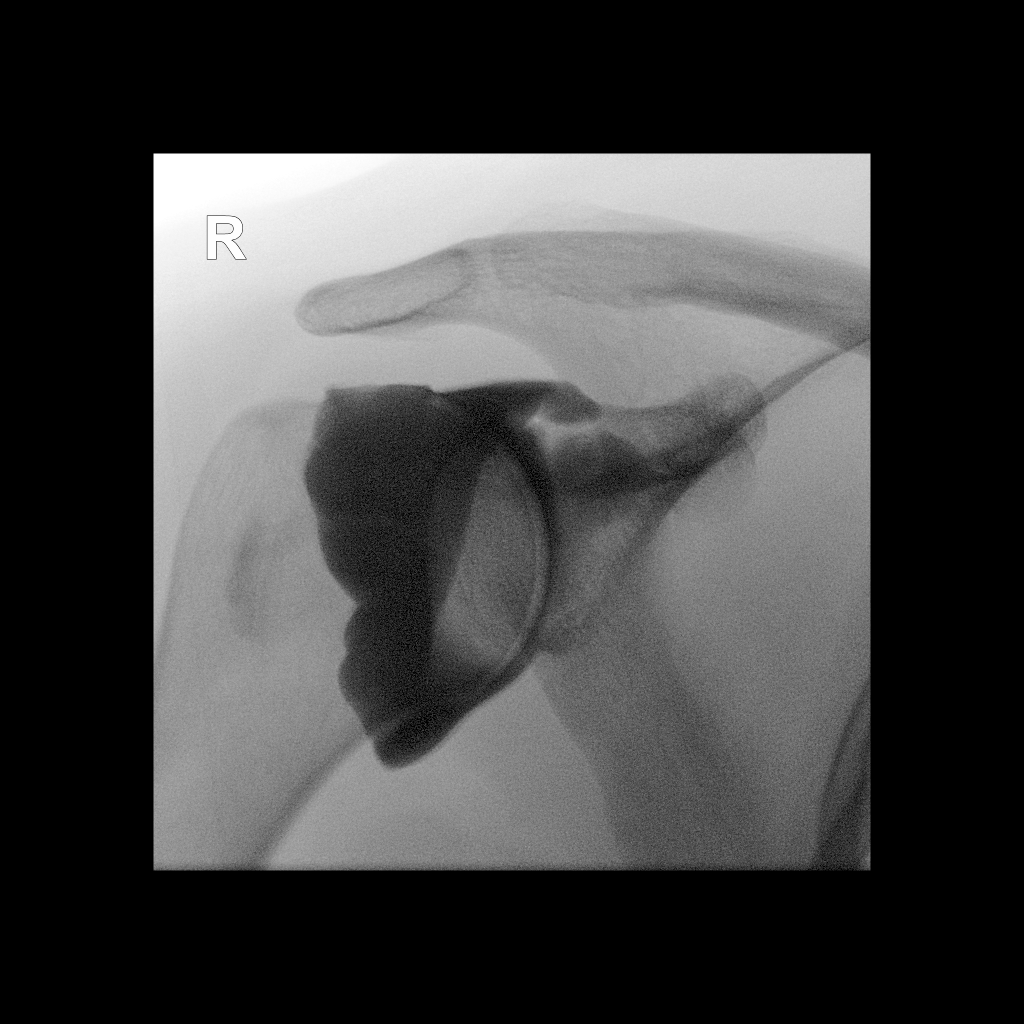

[3 of 3 positions shown; findings below may reference images not displayed]

FLUOROSCOPY TIME:  0 minutes 24 seconds. 7.61 micro gray meter
squared

PROCEDURE:
Right SHOULDER INJECTION UNDER FLUOROSCOPY

The skin overlying the shoulder was scrubbed with Betadine and
draped in sterile fashion. Skin and subcutaneous anesthesia was
carried out using a 25 gauge needle and 1% lidocaine. A 22 gauge
spinal needle was directed under fluoroscopic guidance on one pass
into the glenohumeral joint. 20 cc of a mixture of 0.1 cc MultiHance
and dilute Isovue 200 was then used to fill the glenohumeral joint.
Limited filming does not show any under surface cuff abnormality.
IMPRESSION: Technically successful right shoulder injection for MRI.

## 2020-01-24 MED ORDER — IOPAMIDOL (ISOVUE-M 200) INJECTION 41%
20.0000 mL | Freq: Once | INTRAMUSCULAR | Status: AC
  Administered 2020-01-24: 20 mL via INTRA_ARTICULAR

## 2020-02-17 ENCOUNTER — Other Ambulatory Visit: Payer: Self-pay | Admitting: Internal Medicine

## 2020-02-17 DIAGNOSIS — M25551 Pain in right hip: Secondary | ICD-10-CM

## 2020-02-26 ENCOUNTER — Ambulatory Visit
Admission: RE | Admit: 2020-02-26 | Discharge: 2020-02-26 | Disposition: A | Discharge status: Home / Self Care | Payer: No Typology Code available for payment source | Source: Ambulatory Visit | Attending: Internal Medicine | Admitting: Internal Medicine

## 2020-02-26 ENCOUNTER — Other Ambulatory Visit: Payer: Self-pay

## 2020-02-26 DIAGNOSIS — M25551 Pain in right hip: Secondary | ICD-10-CM
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
## 2020-03-02 ENCOUNTER — Other Ambulatory Visit: Payer: No Typology Code available for payment source

## 2021-01-09 ENCOUNTER — Other Ambulatory Visit: Payer: Self-pay | Admitting: Sports Medicine

## 2021-01-09 DIAGNOSIS — G8929 Other chronic pain: Secondary | ICD-10-CM

## 2021-01-09 DIAGNOSIS — M25561 Pain in right knee: Secondary | ICD-10-CM

## 2021-01-14 ENCOUNTER — Ambulatory Visit
Admission: RE | Admit: 2021-01-14 | Discharge: 2021-01-14 | Disposition: A | Discharge status: Home / Self Care | Payer: BLUE CROSS/BLUE SHIELD | Source: Ambulatory Visit | Attending: Sports Medicine | Admitting: Sports Medicine

## 2021-01-14 ENCOUNTER — Other Ambulatory Visit: Payer: Self-pay

## 2021-01-14 DIAGNOSIS — M25561 Pain in right knee: Secondary | ICD-10-CM

## 2021-01-14 DIAGNOSIS — G8929 Other chronic pain: Secondary | ICD-10-CM
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
## 2021-01-21 ENCOUNTER — Other Ambulatory Visit: Payer: No Typology Code available for payment source
